# Patient Record
Sex: Male | Born: 1965 | Race: Black or African American | Hispanic: No | Marital: Single | Smoking: Current every day smoker
Health system: Southern US, Community
[De-identification: ages and names within clinical notes are randomized; demographics above are authoritative.]

---

## 2004-09-30 ENCOUNTER — Emergency Department (HOSPITAL_COMMUNITY): Admission: EM | Admit: 2004-09-30 | Discharge: 2004-09-30 | Payer: Self-pay | Admitting: Emergency Medicine

## 2004-10-14 ENCOUNTER — Emergency Department (HOSPITAL_COMMUNITY): Admission: EM | Admit: 2004-10-14 | Discharge: 2004-10-14 | Payer: Self-pay | Admitting: Family Medicine

## 2004-11-27 ENCOUNTER — Emergency Department (HOSPITAL_COMMUNITY): Admission: EM | Admit: 2004-11-27 | Discharge: 2004-11-27 | Payer: Self-pay | Admitting: Emergency Medicine

## 2004-12-14 ENCOUNTER — Emergency Department (HOSPITAL_COMMUNITY): Admission: EM | Admit: 2004-12-14 | Discharge: 2004-12-14 | Payer: Self-pay | Admitting: Family Medicine

## 2010-04-25 ENCOUNTER — Emergency Department (HOSPITAL_COMMUNITY): Admission: EM | Admit: 2010-04-25 | Discharge: 2010-04-25 | Payer: Self-pay | Admitting: Emergency Medicine

## 2010-05-01 ENCOUNTER — Emergency Department (HOSPITAL_COMMUNITY): Admission: EM | Admit: 2010-05-01 | Discharge: 2010-05-01 | Payer: Self-pay | Admitting: Emergency Medicine
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
## 2016-06-05 ENCOUNTER — Encounter (HOSPITAL_COMMUNITY): Payer: Self-pay | Admitting: Emergency Medicine

## 2016-06-05 ENCOUNTER — Emergency Department (HOSPITAL_COMMUNITY)
Admission: EM | Admit: 2016-06-05 | Discharge: 2016-06-05 | Disposition: A | Discharge status: Home / Self Care | Payer: BLUE CROSS/BLUE SHIELD | Attending: Emergency Medicine | Admitting: Emergency Medicine

## 2016-06-05 ENCOUNTER — Ambulatory Visit (HOSPITAL_COMMUNITY)
Admission: EM | Admit: 2016-06-05 | Discharge: 2016-06-05 | Disposition: A | Discharge status: Nursing Home (Includes ICF) | Payer: BLUE CROSS/BLUE SHIELD

## 2016-06-05 ENCOUNTER — Encounter (HOSPITAL_COMMUNITY): Payer: Self-pay | Admitting: *Deleted

## 2016-06-05 DIAGNOSIS — Z5181 Encounter for therapeutic drug level monitoring: Secondary | ICD-10-CM | POA: Insufficient documentation

## 2016-06-05 DIAGNOSIS — T148XXA Other injury of unspecified body region, initial encounter: Secondary | ICD-10-CM

## 2016-06-05 DIAGNOSIS — G589 Mononeuropathy, unspecified: Secondary | ICD-10-CM | POA: Insufficient documentation

## 2016-06-05 DIAGNOSIS — M6281 Muscle weakness (generalized): Secondary | ICD-10-CM | POA: Diagnosis not present

## 2016-06-05 DIAGNOSIS — R29898 Other symptoms and signs involving the musculoskeletal system: Secondary | ICD-10-CM

## 2016-06-05 DIAGNOSIS — F17210 Nicotine dependence, cigarettes, uncomplicated: Secondary | ICD-10-CM | POA: Diagnosis not present

## 2016-06-05 LAB — CBC WITH DIFFERENTIAL/PLATELET
BASOS ABS: 0 10*3/uL (ref 0.0–0.1)
BASOS PCT: 0 %
EOS ABS: 0 10*3/uL (ref 0.0–0.7)
EOS PCT: 0 %
HCT: 45 % (ref 39.0–52.0)
Hemoglobin: 15.4 g/dL (ref 13.0–17.0)
Lymphocytes Relative: 24 %
Lymphs Abs: 1.9 10*3/uL (ref 0.7–4.0)
MCH: 29.5 pg (ref 26.0–34.0)
MCHC: 34.2 g/dL (ref 30.0–36.0)
MCV: 86.2 fL (ref 78.0–100.0)
MONO ABS: 0.5 10*3/uL (ref 0.1–1.0)
MONOS PCT: 6 %
Neutro Abs: 5.5 10*3/uL (ref 1.7–7.7)
Neutrophils Relative %: 70 %
PLATELETS: 253 10*3/uL (ref 150–400)
RBC: 5.22 MIL/uL (ref 4.22–5.81)
RDW: 13.8 % (ref 11.5–15.5)
WBC: 7.9 10*3/uL (ref 4.0–10.5)

## 2016-06-05 LAB — COMPREHENSIVE METABOLIC PANEL
ALBUMIN: 4.7 g/dL (ref 3.5–5.0)
ALT: 21 U/L (ref 17–63)
ANION GAP: 11 (ref 5–15)
AST: 19 U/L (ref 15–41)
Alkaline Phosphatase: 59 U/L (ref 38–126)
BUN: 8 mg/dL (ref 6–20)
CHLORIDE: 106 mmol/L (ref 101–111)
CO2: 26 mmol/L (ref 22–32)
Calcium: 9.2 mg/dL (ref 8.9–10.3)
Creatinine, Ser: 0.92 mg/dL (ref 0.61–1.24)
GFR calc Af Amer: >60 mL/min (ref >60)
GFR calc non Af Amer: >60 mL/min (ref >60)
GLUCOSE: 99 mg/dL (ref 65–99)
POTASSIUM: 3.8 mmol/L (ref 3.5–5.1)
Sodium: 143 mmol/L (ref 135–145)
TOTAL PROTEIN: 7.4 g/dL (ref 6.5–8.1)
Total Bilirubin: 0.5 mg/dL (ref 0.3–1.2)

## 2016-06-05 LAB — RAPID URINE DRUG SCREEN, HOSP PERFORMED
AMPHETAMINES: NOT DETECTED
BENZODIAZEPINES: NOT DETECTED
Barbiturates: NOT DETECTED
COCAINE: NOT DETECTED
Opiates: NOT DETECTED
Tetrahydrocannabinol: NOT DETECTED

## 2016-06-05 LAB — ETHANOL: ALCOHOL ETHYL (B): 218 mg/dL — AB (ref <5)

## 2016-06-05 LAB — URINALYSIS, ROUTINE W REFLEX MICROSCOPIC
BILIRUBIN URINE: NEGATIVE
GLUCOSE, UA: NEGATIVE mg/dL
HGB URINE DIPSTICK: NEGATIVE
Ketones, ur: NEGATIVE mg/dL
Leukocytes, UA: NEGATIVE
Nitrite: NEGATIVE
Protein, ur: NEGATIVE mg/dL
SPECIFIC GRAVITY, URINE: 1.01 (ref 1.005–1.030)
pH: 5.5 (ref 5.0–8.0)

## 2016-06-05 NOTE — ED Triage Notes (Signed)
Called report to KnappaLori, Charity fundraiserN . Pt being transferred to ED via shuttle.
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
## 2016-06-05 NOTE — ED Triage Notes (Signed)
Pt. Stated, My left arm seems weak and grips are not equal in grip. Pt. Is Alert and oriented to name , place, year. I drank a 5th last night and noticed my arm weakness around 0300 this morning Last normal at midnight.

## 2016-06-05 NOTE — ED Notes (Signed)
No answer @ 1657, called multiple times with no response. Will move patient off the floor to be D/C from system

## 2016-06-05 NOTE — ED Notes (Signed)
Called patient to come back to triage with no answer x 2 @ 1635 and 1643

## 2016-06-05 NOTE — ED Notes (Signed)
Spoke with urgent care RN, Pt. Went to bed last night without any weakness when he woke up this am he was having lt. Arm weakness.  Being sent to us via shuttle for further evaluation.

## 2016-06-05 NOTE — ED Notes (Signed)
EDP at bedside  

## 2016-06-05 NOTE — ED Provider Notes (Signed)
[facility]-EMERGENCY DEPT Provider Note   CSN: [id number] Arrival date & time: 06/05/16  1626     History   Chief Complaint Chief Complaint  Patient presents with  . Extremity Weakness    HPI Patrick Shaw is a 50 y.o. male.  Patient c/o awakening from sleep earlier today and noting that when he went to reach for his beer, that his left hand seemed weak.  He states the whole arm seemed to have normal strength but his hand felt tingly, and that his grip and wrist extension seemed weak.  States he may have slept funny on arm. Notes symptoms have improved, but not resolved completely.  Denies headache. No neck or back pain. No radicular pain. No problems w balance or coordination. Patient is using bilateral arms and hands normally with good strength. Patient denies any trauma or fall.    The history is provided by the patient.  Extremity Weakness  Pertinent negatives include no chest pain, no abdominal pain, no headaches and no shortness of breath.    History reviewed. No pertinent past medical history.  There are no active problems to display for this patient.   History reviewed. No pertinent surgical history.     Home Medications    Prior to Admission medications   Not on File    Family History History reviewed. No pertinent family history.  Social History Social History  Substance Use Topics  . Smoking status: Current Every Day Smoker  . Smokeless tobacco: Current User  . Alcohol use Yes     Allergies   Review of patient's allergies indicates no known allergies.   Review of Systems Review of Systems  Constitutional: Negative for fever.  HENT: Negative for trouble swallowing.   Eyes: Negative for visual disturbance.  Respiratory: Negative for shortness of breath.   Cardiovascular: Negative for chest pain.  Gastrointestinal: Negative for abdominal pain.  Genitourinary: Negative for flank pain.  Musculoskeletal: Positive for extremity weakness. Negative for back  pain and neck pain.  Skin: Negative for rash.  Neurological: Negative for speech difficulty and headaches.  Hematological: Does not bruise/bleed easily.     Physical Exam Updated Vital Signs BP 117/77 (BP Location: Right Arm)   Pulse 72   Temp 98.2 F (36.8 C) (Oral)   Resp 16   SpO2 98%   Physical Exam  Constitutional: He is oriented to person, place, and time. He appears well-developed and well-nourished. No distress.  HENT:  Head: Atraumatic.  Eyes: EOM are normal. Pupils are equal, round, and reactive to light.  Neck: Neck supple. No tracheal deviation present. No thyromegaly present.  No bruits.  Cardiovascular: Normal rate, regular rhythm, normal heart sounds and intact distal pulses.  Exam reveals no gallop and no friction rub.   No murmur heard. Pulmonary/Chest: Effort normal and breath sounds normal. No accessory muscle usage. No respiratory distress.  Abdominal: Soft. He exhibits no distension. There is no tenderness.  Musculoskeletal: He exhibits no edema or tenderness.  Neurological: He is alert and oriented to person, place, and time. No cranial nerve deficit.  Speech clear/fluent. Motor intact bil, stre 5/5. sens grossly intact. No pronator drift. Finger to nose intact/normal. Ambulates w steady gait.   Skin: Skin is warm and dry. He is not diaphoretic.  Psychiatric:  Alert, content.   Nursing note and vitals reviewed.    ED Treatments / Results  Labs (all labs ordered are listed, but only abnormal results are displayed) Results for orders placed or performed during the  hospital encounter of 06/05/16  CBC with Differential  Result Value Ref Range   WBC 7.9 4.0 - 10.5 K/uL   RBC 5.22 4.22 - 5.81 MIL/uL   Hemoglobin 15.4 13.0 - 17.0 g/dL   HCT 16.1 09.6 - 04.5 %   MCV 86.2 78.0 - 100.0 fL   MCH 29.5 26.0 - 34.0 pg   MCHC 34.2 30.0 - 36.0 g/dL   RDW 40.9 81.1 - 91.4 %   Platelets 253 150 - 400 K/uL   Neutrophils Relative % 70 %   Neutro Abs 5.5 1.7 -  7.7 K/uL   Lymphocytes Relative 24 %   Lymphs Abs 1.9 0.7 - 4.0 K/uL   Monocytes Relative 6 %   Monocytes Absolute 0.5 0.1 - 1.0 K/uL   Eosinophils Relative 0 %   Eosinophils Absolute 0.0 0.0 - 0.7 K/uL   Basophils Relative 0 %   Basophils Absolute 0.0 0.0 - 0.1 K/uL  Comprehensive metabolic panel  Result Value Ref Range   Sodium 143 135 - 145 mmol/L   Potassium 3.8 3.5 - 5.1 mmol/L   Chloride 106 101 - 111 mmol/L   CO2 26 22 - 32 mmol/L   Glucose, Bld 99 65 - 99 mg/dL   BUN 8 6 - 20 mg/dL   Creatinine, Ser 7.82 0.61 - 1.24 mg/dL   Calcium 9.2 8.9 - 95.6 mg/dL   Total Protein 7.4 6.5 - 8.1 g/dL   Albumin 4.7 3.5 - 5.0 g/dL   AST 19 15 - 41 U/L   ALT 21 17 - 63 U/L   Alkaline Phosphatase 59 38 - 126 U/L   Total Bilirubin 0.5 0.3 - 1.2 mg/dL   GFR calc non Af Amer >60 >60 mL/min   GFR calc Af Amer >60 >60 mL/min   Anion gap 11 5 - 15  Ethanol  Result Value Ref Range   Alcohol, Ethyl (B) 218 (H) <5 mg/dL  Rapid urine drug screen (hospital performed)  Result Value Ref Range   Opiates NONE DETECTED NONE DETECTED   Cocaine NONE DETECTED NONE DETECTED   Benzodiazepines NONE DETECTED NONE DETECTED   Amphetamines NONE DETECTED NONE DETECTED   Tetrahydrocannabinol NONE DETECTED NONE DETECTED   Barbiturates NONE DETECTED NONE DETECTED  Urinalysis, Routine w reflex microscopic (not at [facility])  Result Value Ref Range   Color, Urine YELLOW YELLOW   APPearance CLEAR CLEAR   Specific Gravity, Urine 1.010 1.005 - 1.030   pH 5.5 5.0 - 8.0   Glucose, UA NEGATIVE NEGATIVE mg/dL   Hgb urine dipstick NEGATIVE NEGATIVE   Bilirubin Urine NEGATIVE NEGATIVE   Ketones, ur NEGATIVE NEGATIVE mg/dL   Protein, ur NEGATIVE NEGATIVE mg/dL   Nitrite NEGATIVE NEGATIVE   Leukocytes, UA NEGATIVE NEGATIVE   EKG  EKG Interpretation None       Radiology No results found.  Procedures Procedures (including critical care time)  Medications Ordered in ED Medications - No data to  display   Initial Impression / Assessment and Plan / ED Course  I have reviewed the triage vital signs and the nursing notes.  Pertinent labs & imaging results that were available during my care of the patient were reviewed by me and considered in my medical decision making (see chart for details).  Clinical Course    Labs sent.  Etoh high, otherwise labs unremarkable.   Patient has a ride, does not have to drive.   No deficits noted on neuro exam, and earlier symptoms only related to possibly  hand grip and wrist extension ?nerve compression - ?resolved and normal exam currently.   Patient currently appears stable for d/c.   Final Clinical Impressions(s) / ED Diagnoses   Final diagnoses:  None    New Prescriptions New Prescriptions   No medications on file     Cathren Laine, MD 06/05/16 2247

## 2016-06-05 NOTE — ED Triage Notes (Signed)
Patrick Shaw   Is no longer visible in the waiting room
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
## 2016-06-05 NOTE — ED Triage Notes (Signed)
The pt came to desk after he had been  Called and could not be located.  He reports that he was outside he was told that his name had been called with no response   If he wants to be seen he willhave to stay inside he was  Missing for 30 minutes

## 2019-02-05 ENCOUNTER — Other Ambulatory Visit: Payer: Self-pay

## 2019-02-05 ENCOUNTER — Emergency Department (HOSPITAL_COMMUNITY)
Admission: EM | Admit: 2019-02-05 | Discharge: 2019-02-05 | Disposition: A | Discharge status: Home / Self Care | Payer: BLUE CROSS/BLUE SHIELD | Attending: Emergency Medicine | Admitting: Emergency Medicine

## 2019-02-05 ENCOUNTER — Encounter (HOSPITAL_COMMUNITY): Payer: Self-pay | Admitting: Emergency Medicine

## 2019-02-05 DIAGNOSIS — Y929 Unspecified place or not applicable: Secondary | ICD-10-CM | POA: Diagnosis not present

## 2019-02-05 DIAGNOSIS — Y999 Unspecified external cause status: Secondary | ICD-10-CM | POA: Insufficient documentation

## 2019-02-05 DIAGNOSIS — R51 Headache: Secondary | ICD-10-CM | POA: Diagnosis not present

## 2019-02-05 DIAGNOSIS — Z23 Encounter for immunization: Secondary | ICD-10-CM | POA: Diagnosis not present

## 2019-02-05 DIAGNOSIS — S0181XA Laceration without foreign body of other part of head, initial encounter: Secondary | ICD-10-CM | POA: Diagnosis present

## 2019-02-05 DIAGNOSIS — Y939 Activity, unspecified: Secondary | ICD-10-CM | POA: Diagnosis not present

## 2019-02-05 DIAGNOSIS — F17210 Nicotine dependence, cigarettes, uncomplicated: Secondary | ICD-10-CM | POA: Insufficient documentation

## 2019-02-05 MED ORDER — LIDOCAINE HCL 2 % IJ SOLN
20.0000 mL | Freq: Once | INTRAMUSCULAR | Status: AC
  Administered 2019-02-05: 10:00:00 400 mg

## 2019-02-05 MED ORDER — TETANUS-DIPHTH-ACELL PERTUSSIS 5-2.5-18.5 LF-MCG/0.5 IM SUSP
0.5000 mL | Freq: Once | INTRAMUSCULAR | Status: AC
  Administered 2019-02-05: 10:00:00 0.5 mL via INTRAMUSCULAR
  Filled 2019-02-05: qty 0.5
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
## 2019-02-05 NOTE — Discharge Instructions (Signed)
Please read attached information. If you experience any new or worsening signs or symptoms please return to the emergency room for evaluation. Please follow-up with your primary care provider or specialist as discussed. Please use medication prescribed only as directed and discontinue taking if you have any concerning signs or symptoms.   °

## 2019-10-29 ENCOUNTER — Ambulatory Visit: Payer: Self-pay | Attending: Internal Medicine

## 2019-10-29 DIAGNOSIS — Z20822 Contact with and (suspected) exposure to covid-19: Secondary | ICD-10-CM | POA: Insufficient documentation

## 2019-10-30 LAB — NOVEL CORONAVIRUS, NAA: SARS-CoV-2, NAA: NOT DETECTED

## 2019-10-31 ENCOUNTER — Telehealth: Payer: Self-pay | Admitting: General Practice

## 2019-10-31 NOTE — Telephone Encounter (Signed)
Negative COVID results given. Patient results "NOT Detected." Caller expressed understanding. ° °

## 2022-12-06 ENCOUNTER — Ambulatory Visit (HOSPITAL_COMMUNITY)
Admission: EM | Admit: 2022-12-06 | Discharge: 2022-12-06 | Disposition: A | Discharge status: Home / Self Care | Payer: BC Managed Care – PPO | Attending: Internal Medicine | Admitting: Internal Medicine

## 2022-12-06 ENCOUNTER — Encounter (HOSPITAL_COMMUNITY): Payer: Self-pay

## 2022-12-06 ENCOUNTER — Ambulatory Visit (INDEPENDENT_AMBULATORY_CARE_PROVIDER_SITE_OTHER): Payer: BC Managed Care – PPO

## 2022-12-06 DIAGNOSIS — W19XXXA Unspecified fall, initial encounter: Secondary | ICD-10-CM

## 2022-12-06 DIAGNOSIS — S8264XA Nondisplaced fracture of lateral malleolus of right fibula, initial encounter for closed fracture: Secondary | ICD-10-CM | POA: Diagnosis not present

## 2022-12-06 DIAGNOSIS — M25571 Pain in right ankle and joints of right foot: Secondary | ICD-10-CM

## 2022-12-06 MED ORDER — IBUPROFEN 600 MG PO TABS: 600.0000 mg | ORAL_TABLET | Freq: Four times a day (QID) | ORAL | 0 refills | Status: AC | PRN
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
## 2022-12-06 NOTE — Discharge Instructions (Addendum)
Nonweightbearing until you see your orthopedic surgery team Ibuprofen as needed for pain Elevation of the right leg Return to urgent care if you have worsening symptoms Please call the orthopedic surgeons office and make an appointment to be seen by the end of this week.

## 2022-12-06 NOTE — ED Notes (Signed)
Ortho notified for splint.

## 2022-12-06 NOTE — ED Triage Notes (Signed)
Pt states tripped and fell last night rolling his rt ankle. States iced it last night. States having swelling and pain.

## 2022-12-06 NOTE — Progress Notes (Signed)
Orthopedic Tech Progress Note Patient Details:  Patrick Shaw 10/31/1965 [id number]  Ortho Devices Type of Ortho Device: Post (short leg) splint, Crutches Ortho Device/Splint Location: RLE Ortho Device/Splint Interventions: Ordered, Application, Adjustment   Post Interventions Patient Tolerated: Well Instructions Provided: Care of device, Poper ambulation with device  Henley Boettner A Allahna Husband 12/06/2022, 3:42 PM

## 2022-12-07 ENCOUNTER — Ambulatory Visit (INDEPENDENT_AMBULATORY_CARE_PROVIDER_SITE_OTHER): Payer: BC Managed Care – PPO | Admitting: Orthopaedic Surgery

## 2022-12-07 DIAGNOSIS — S82891A Other fracture of right lower leg, initial encounter for closed fracture: Secondary | ICD-10-CM

## 2022-12-07 DIAGNOSIS — S82892A Other fracture of left lower leg, initial encounter for closed fracture: Secondary | ICD-10-CM

## 2022-12-07 NOTE — Progress Notes (Signed)
Office Visit Note   Patient: Patrick Shaw           Date of Birth: 07-Jun-1966           MRN: [id number] Visit Date: 12/07/2022              Requested by: No referring provider defined for this encounter. PCP: Patient, No Pcp Per   Assessment & Plan: Visit Diagnoses: No diagnosis found.  Plan: Crutches adjusted work slip given no work x 2 months recheck 1 week for splint removal application of short leg fiberglass cast nonweightbearing and then new x-rays in his new cast.GLOBAL close treatment lateral malleolar fracture with without manipulation.  Follow-Up Instructions: Return in about 1 week (around 12/14/2022).   Orders:  No orders of the defined types were placed in this encounter.  No orders of the defined types were placed in this encounter.     Procedures: No procedures performed   Clinical Data: No additional findings.   Subjective: Chief Complaint  Patient presents with   Right Ankle - Fracture    HPI 57 year old male referred by urgent care after he was seen there yesterday when he had on his cowboy boots stepped up on a step at home and rolled his ankle with sharp lateral pain with attempts at weightbearing.  He thought he may have do sprained his ankle and x-rays demonstrated a nondisplaced slightly oblique fibular fracture at the level of the mortise nondisplaced in all 3 views.  He is placed in a splint he had unwrapped the splint as he states it was too tight.  No past history of injury to his ankle.  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  He has crutches which were not adjusted appropriately and we fixed him while he was here today.  X-ray showed a tiny chip of the medial malleolus but this may be old since it appears somewhat rounded.  Splint is on so unable to confirm this with point tenderness.  Will reexamine in 1 week when he returns.  Review of Systems positive smoker.  All systems noncontributory  HPI.   Objective: Vital Signs: There were no vitals taken for this visit.  Physical Exam Constitutional:      Appearance: He is well-developed.  HENT:     Head: Normocephalic and atraumatic.     Right Ear: External ear normal.     Left Ear: External ear normal.  Eyes:     Pupils: Pupils are equal, round, and reactive to light.  Neck:     Thyroid: No thyromegaly.     Trachea: No tracheal deviation.  Cardiovascular:     Rate and Rhythm: Normal rate.  Pulmonary:     Effort: Pulmonary effort is normal.     Breath sounds: No wheezing.  Abdominal:     General: Bowel sounds are normal.     Palpations: Abdomen is soft.  Musculoskeletal:     Cervical back: Neck supple.  Skin:    General: Skin is warm and dry.     Capillary Refill: Capillary refill takes less than 2 seconds.  Neurological:     Mental Status: He is alert and oriented to person, place, and time.  Psychiatric:        Behavior: Behavior normal.        Thought Content: Thought content normal.        Judgment: Judgment normal.     Ortho Exam Short  leg splint intact.  Specialty Comments:  No specialty comments available.  Imaging: DG Ankle Complete Right  Result Date: 12/06/2022 CLINICAL DATA:  Fall, rolling RIGHT ankle with ankle pain and swelling. EXAM: RIGHT ANKLE - COMPLETE 3+ VIEW COMPARISON:  Tibia and fibula from 2006. FINDINGS: Oblique fracture through the lateral malleolus with overlying soft tissue swelling. Small bone fragment about the distal tip of the medial malleolus. No sign of dislocation. Enthesopathic changes upon the calcaneus and midfoot degenerative changes. IMPRESSION: 1. Oblique fracture through the lateral malleolus with overlying soft tissue swelling. 2. Small bone fragment about the distal tip of the medial malleolus may represent a small avulsion fracture. Associated deltoid ligament injury may be present, potentially a bimalleolar equivalent injury. Correlate with any signs of ankle  instability. Electronically Signed   By: Zetta Bills M.D.   On: 12/06/2022 13:36     PMFS History: There are no problems to display for this patient.  No past medical history on file.  No family history on file.  No past surgical history on file. Social History   Occupational History   Not on file  Tobacco Use   Smoking status: Every Day   Smokeless tobacco: Current  Substance and Sexual Activity   Alcohol use: Yes   Drug use: No   Sexual activity: Not on file

## 2022-12-14 ENCOUNTER — Ambulatory Visit (INDEPENDENT_AMBULATORY_CARE_PROVIDER_SITE_OTHER): Payer: BC Managed Care – PPO | Admitting: Orthopaedic Surgery

## 2022-12-14 ENCOUNTER — Other Ambulatory Visit: Payer: Self-pay

## 2022-12-14 ENCOUNTER — Encounter: Payer: Self-pay | Admitting: Orthopaedic Surgery

## 2022-12-14 VITALS — Wt 190.0 lb

## 2022-12-14 DIAGNOSIS — S82891D Other fracture of right lower leg, subsequent encounter for closed fracture with routine healing: Secondary | ICD-10-CM

## 2022-12-14 NOTE — Progress Notes (Signed)
   Post-Op Visit Note   Patient: Patrick Shaw           Date of Birth: 09/04/1966           MRN: [id number] Visit Date: 12/14/2022 PCP: Patient, No Pcp Per   Assessment & Plan Global for right nondisplaced lateral malleolar fracture.  No x-ray obtained today shows the fracture remains nondisplaced.  He is placed in a short leg fiberglass cast.  The splint that have been applied at the urgent care because area of erythema without skin breakdown over the dorsum of the foot where he is having significant pain.  There is a pressure-point area.  Extra padding applied short leg cast applied.  Return 4 weeks for cast off and x-rays out of cast.  He works in [employer] and work slip given no work x 6 weeks.  Chief Complaint:  Chief Complaint  Patient presents with   Right Ankle - Follow-up, Fracture   Visit Diagnoses:  1. Closed right ankle fracture, with routine healing, subsequent encounter     Plan: Continue nonweightbearing.  Return 4 weeks for cast off x-rays out of cast.  Follow-Up Instructions: No follow-ups on file.   Orders:  Orders Placed This Encounter  Procedures   XR Ankle Complete Right   No orders of the defined types were placed in this encounter.   Imaging: No results found.  PMFS History: Patient Active Problem List   Diagnosis Date Noted   Closed left ankle fracture 12/07/2022   No past medical history on file.  No family history on file.  No past surgical history on file. Social History   Occupational History   Not on file  Tobacco Use   Smoking status: Every Day   Smokeless tobacco: Current  Substance and Sexual Activity   Alcohol use: Yes   Drug use: No   Sexual activity: Not on file

## 2023-01-14 ENCOUNTER — Ambulatory Visit (INDEPENDENT_AMBULATORY_CARE_PROVIDER_SITE_OTHER): Payer: BC Managed Care – PPO | Admitting: Orthopaedic Surgery

## 2023-01-14 ENCOUNTER — Other Ambulatory Visit (INDEPENDENT_AMBULATORY_CARE_PROVIDER_SITE_OTHER): Payer: BC Managed Care – PPO

## 2023-01-14 ENCOUNTER — Encounter: Payer: Self-pay | Admitting: Orthopaedic Surgery

## 2023-01-14 VITALS — BP 127/78 | HR 93 | Wt 190.0 lb

## 2023-01-14 DIAGNOSIS — S82891D Other fracture of right lower leg, subsequent encounter for closed fracture with routine healing: Secondary | ICD-10-CM | POA: Diagnosis not present

## 2023-01-14 DIAGNOSIS — S82892D Other fracture of left lower leg, subsequent encounter for closed fracture with routine healing: Secondary | ICD-10-CM

## 2023-01-14 NOTE — Progress Notes (Signed)
   Post-Op Visit Note   Patient: Patrick Shaw           Date of Birth: 05/23/1966           MRN: [id number] Visit Date: 01/14/2023 PCP: Patient, No Pcp Per   Assessment & Plan:Global nondisplaced lateral malleolar fracture right ankle with removal fiberglass cast today.  Chief Complaint:  Chief Complaint  Patient presents with   Right Ankle - Follow-up, Fracture    DOI 12/06/2022   Visit Diagnoses:  1. Closed right ankle fracture, with routine healing, subsequent encounter   2. Closed fracture of left ankle with routine healing, subsequent encounter     Plan: Cam boot applied he can remove it for bathing he can work on ankle range of motion out of his boot.  He uses crutches for distance he can walk in the house with the boot only.  Recheck 4 weeks he will bring his shoe and we can then discuss work resumption.  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  Follow-Up Instructions: Return in about 4 weeks (around 02/11/2023).   Orders:  Orders Placed This Encounter  Procedures   XR Ankle Complete Right   No orders of the defined types were placed in this encounter.   Imaging: XR Ankle Complete Right  Result Date: 01/14/2023 Three-view x-rays right ankle obtained and reviewed this shows interval healing partial of the nondisplaced lateral malleolar fracture.  No widening of the medial clear space. Impression: Progressive interval healing lateral malleolar fracture nondisplaced right ankle.   PMFS History: Patient Active Problem List   Diagnosis Date Noted   Closed left ankle fracture 12/07/2022   No past medical history on file.  No family history on file.  No past surgical history on file. Social History   Occupational History   Not on file  Tobacco Use   Smoking status: Every Day   Smokeless tobacco: Current  Substance and Sexual Activity   Alcohol use: Yes   Drug use: No   Sexual activity: Not on file

## 2023-02-11 ENCOUNTER — Encounter: Payer: Self-pay | Admitting: Orthopaedic Surgery

## 2023-02-11 ENCOUNTER — Other Ambulatory Visit (INDEPENDENT_AMBULATORY_CARE_PROVIDER_SITE_OTHER): Payer: BC Managed Care – PPO

## 2023-02-11 ENCOUNTER — Ambulatory Visit (INDEPENDENT_AMBULATORY_CARE_PROVIDER_SITE_OTHER): Payer: BC Managed Care – PPO | Admitting: Orthopaedic Surgery

## 2023-02-11 VITALS — BP 136/92

## 2023-02-11 DIAGNOSIS — S82891D Other fracture of right lower leg, subsequent encounter for closed fracture with routine healing: Secondary | ICD-10-CM | POA: Diagnosis not present

## 2023-02-11 NOTE — Progress Notes (Signed)
   Post-Op Visit Note   Patient: Patrick Shaw           Date of Birth: 07/08/1966           MRN: [id number] Visit Date: 02/11/2023 PCP: Patient, No Pcp Per   Assessment & Plan: Follow up right nondisplaced lateral malleolar fracture just below the level of the mortise.  He is nontender x-rays demonstrate healing on the cortex.  Metaphyseal intramedullary region still shows slight lucent line.  He has been walking some in his house and his work boots without problems.  He is ready to resume work on 02/14/2023 without restrictions work note given.   Chief Complaint:  Chief Complaint  Patient presents with   Right Ankle - Follow-up, Fracture    DOI 12/06/2022   Visit Diagnoses:  1. Closed right ankle fracture, with routine healing, subsequent encounter     Plan: Follow-up as needed.  Follow-Up Instructions: No follow-ups on file.   Orders:  Orders Placed This Encounter  Procedures   XR Ankle Complete Right   No orders of the defined types were placed in this encounter.   Imaging: No results found.  PMFS History: Patient Active Problem List   Diagnosis Date Noted   Closed left ankle fracture 12/07/2022   No past medical history on file.  No family history on file.  No past surgical history on file. Social History   Occupational History   Not on file  Tobacco Use   Smoking status: Every Day   Smokeless tobacco: Current  Substance and Sexual Activity   Alcohol use: Yes   Drug use: No   Sexual activity: Not on file
